# Patient Record
Sex: Male | Born: 1965 | Race: Black or African American | Hispanic: No
Health system: Southern US, Community
[De-identification: ages and names within clinical notes are randomized; demographics above are authoritative.]

## PROBLEM LIST (undated history)

## (undated) DIAGNOSIS — L910 Hypertrophic scar: Secondary | ICD-10-CM

---

## 1998-12-19 ENCOUNTER — Encounter: Payer: Self-pay | Admitting: Emergency Medicine

## 1998-12-19 ENCOUNTER — Emergency Department (HOSPITAL_COMMUNITY): Admission: EM | Admit: 1998-12-19 | Discharge: 1998-12-19 | Payer: Self-pay | Admitting: Emergency Medicine

## 2006-03-05 ENCOUNTER — Emergency Department (HOSPITAL_COMMUNITY): Admission: EM | Admit: 2006-03-05 | Discharge: 2006-03-05 | Payer: Self-pay | Admitting: Emergency Medicine

## 2006-03-12 ENCOUNTER — Emergency Department (HOSPITAL_COMMUNITY): Admission: EM | Admit: 2006-03-12 | Discharge: 2006-03-12 | Payer: Self-pay | Admitting: *Deleted

## 2007-05-06 ENCOUNTER — Emergency Department (HOSPITAL_COMMUNITY): Admission: EM | Admit: 2007-05-06 | Discharge: 2007-05-06 | Payer: Self-pay | Admitting: Emergency Medicine

## 2008-02-12 ENCOUNTER — Observation Stay (HOSPITAL_COMMUNITY): Admission: EM | Admit: 2008-02-12 | Discharge: 2008-02-12 | Payer: Self-pay | Admitting: Emergency Medicine

## 2008-11-27 ENCOUNTER — Emergency Department (HOSPITAL_COMMUNITY): Admission: EM | Admit: 2008-11-27 | Discharge: 2008-11-27 | Payer: Self-pay | Admitting: Emergency Medicine

## 2009-06-16 IMAGING — CR DG CHEST 2V
3 series · 3 of 3 positions shown · non-contrast
Comparison: None

CLINICAL DATA: Chest pain

CHEST - 2 VIEW

[w chest pa]
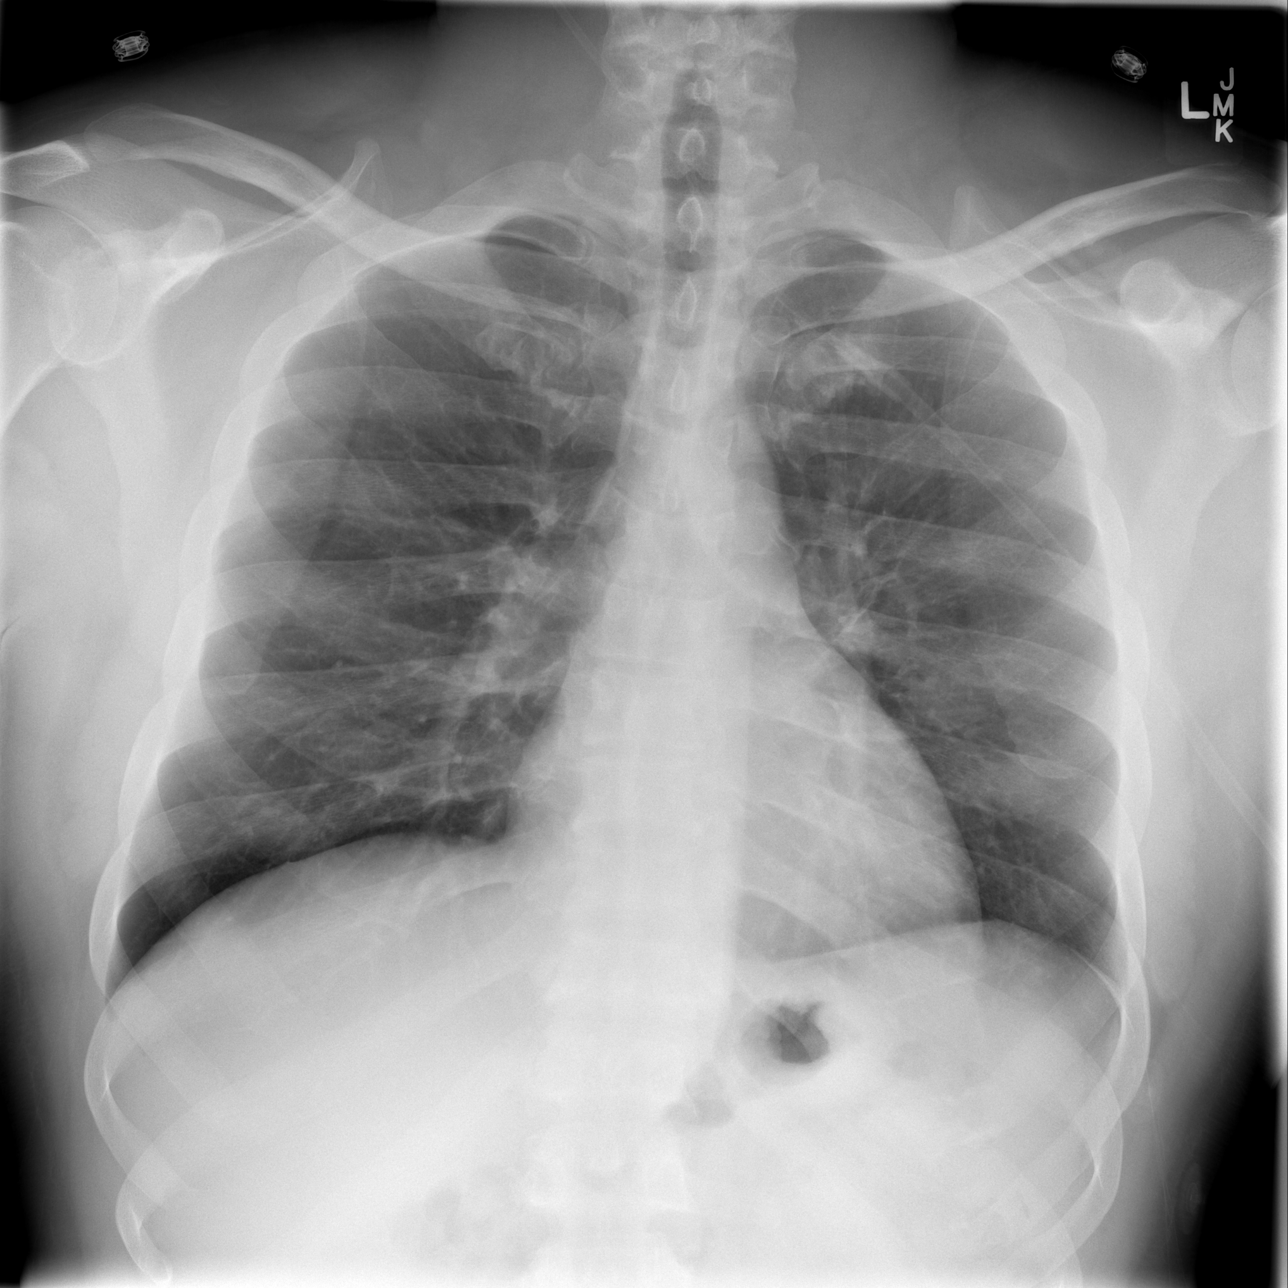

[w chest lat (1 of 2)]
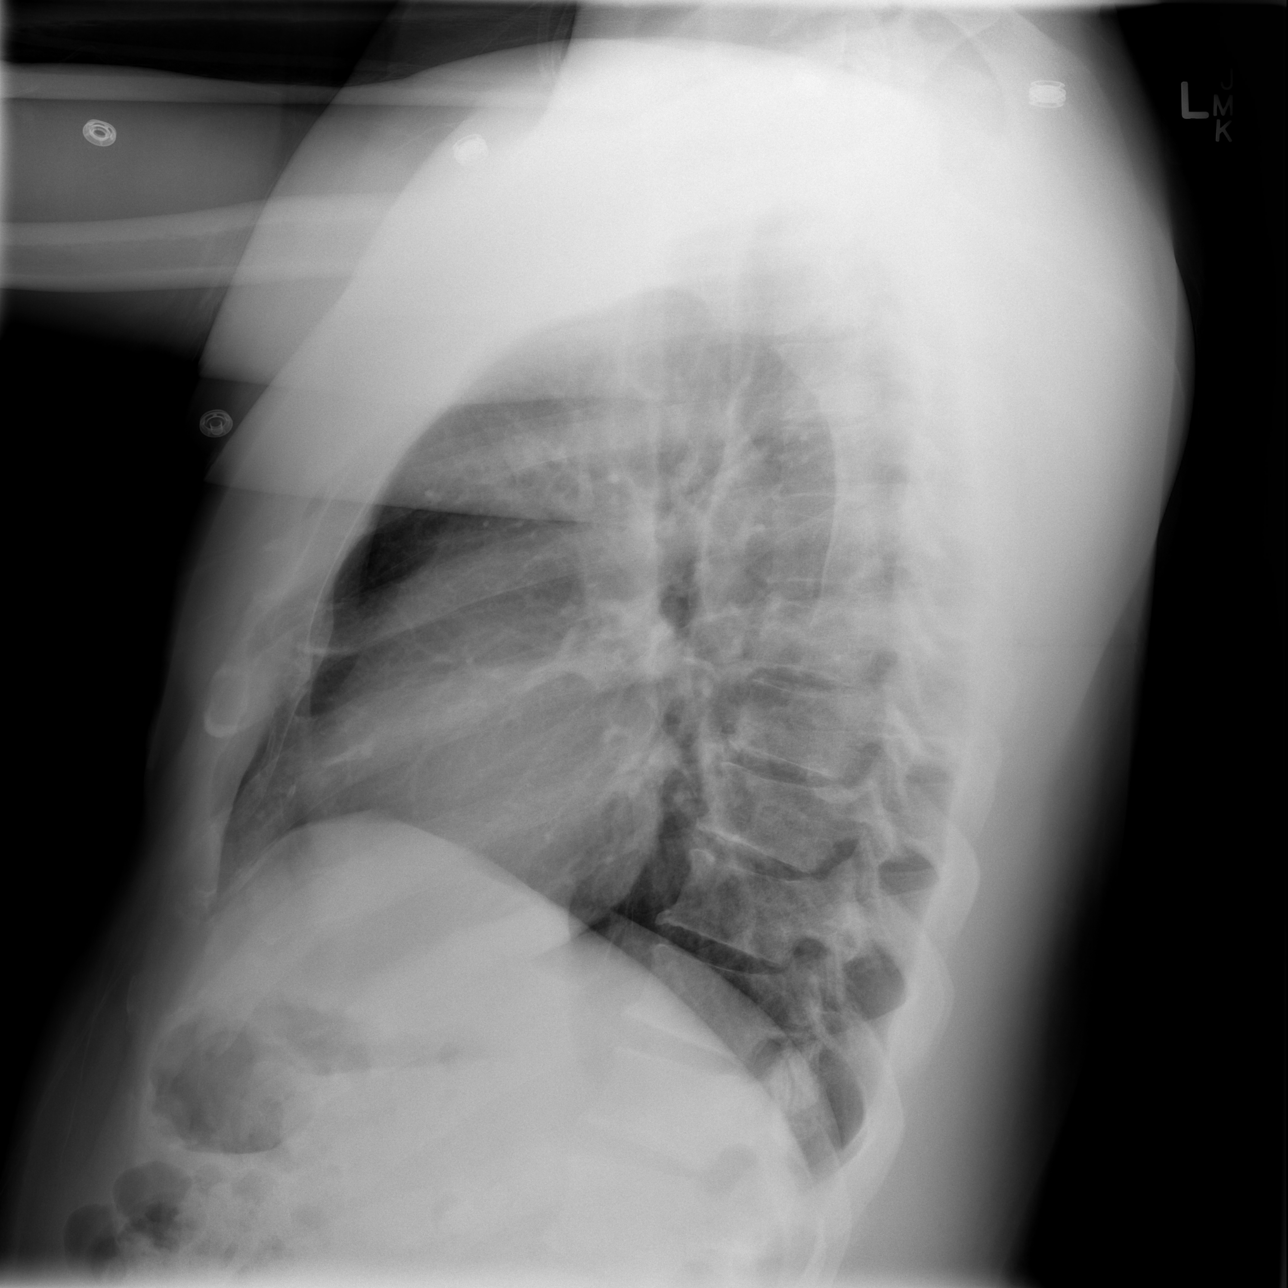

[w chest lat (2 of 2)]
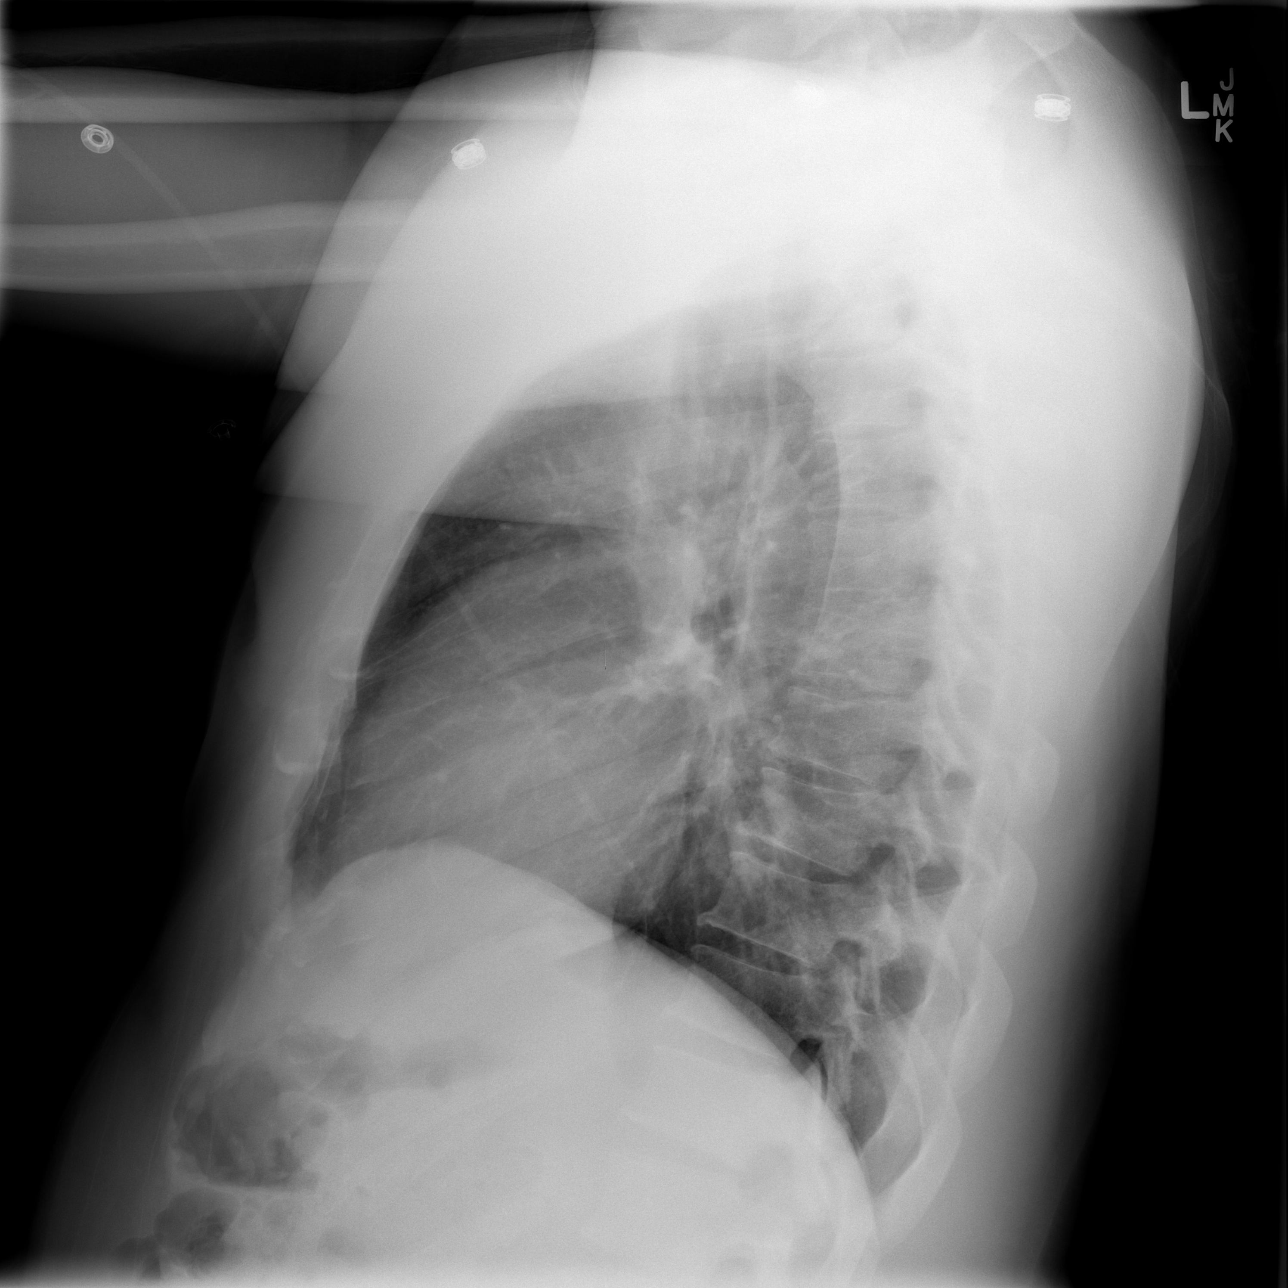

[3 of 3 positions shown; findings below may reference images not displayed]

FINDINGS: The heart size and mediastinal contours are within normal
limits.  Both lungs are clear.  The visualized skeletal structures
are unremarkable.
IMPRESSION: No active cardiopulmonary disease.

## 2011-01-16 LAB — CBC
HCT: 40.8 % (ref 39.0–52.0)
Hemoglobin: 13.8 g/dL (ref 13.0–17.0)
MCV: 91.5 fL (ref 78.0–100.0)
Platelets: 161 10*3/uL (ref 150–400)
WBC: 5.4 10*3/uL (ref 4.0–10.5)

## 2011-01-16 LAB — POCT CARDIAC MARKERS
CKMB, poc: 1.2 ng/mL (ref 1.0–8.0)
CKMB, poc: 1.8 ng/mL (ref 1.0–8.0)
Myoglobin, poc: 60.4 ng/mL (ref 12–200)
Troponin i, poc: <0.05 ng/mL (ref 0.00–0.09)
Troponin i, poc: <0.05 ng/mL (ref 0.00–0.09)

## 2011-01-16 LAB — BASIC METABOLIC PANEL
BUN: 15 mg/dL (ref 6–23)
Chloride: 101 mEq/L (ref 96–112)
GFR calc non Af Amer: >60 mL/min (ref >60)
Potassium: 3.8 mEq/L (ref 3.5–5.1)
Sodium: 133 mEq/L — ABNORMAL LOW (ref 135–145)

## 2011-02-13 NOTE — Discharge Summary (Signed)
Troy Lowery, Troy Lowery             ACCOUNT NO.:  [id number]   MEDICAL RECORD NO.:  [id number]          PATIENT TYPE:  INP   LOCATION:  5120                         FACILITY:  [facility]   PHYSICIAN:  Wilmon Arms. Corliss Skains, M.D. DATE OF BIRTH:  1966-04-09   DATE OF ADMISSION:  02/11/2008  DATE OF DISCHARGE:  02/12/2008                               DISCHARGE SUMMARY   ADMITTING TRAUMA SURGEON:  Dr. Jimmye Norman.   DISCHARGE DIAGNOSES:  1. Fall while playing basketball after being accidentally struck on      the face.  2. Concussion with brief loss of consciousness.  3. Posterior scalp laceration.  4. Mild acute blood loss anemia.  5. History of a heat stroke in 2008.   PROCEDURES:  The patient had a closure of his posterior scalp  laceration, I believe per Dr. Lindie Spruce on Feb 12, 2008.   HISTORY:  This is an otherwise healthy 45 year old black male who was  accidentally hit on the head with an elbow while playing basketball.  He  fell backwards and struck the back of his scalp and was briefly not  pale.  He had a CT scan of the head, C-spine, and maxillofacial CTs all  of which were negative for acute injuries.  The patient did have a right  posterior occipital scalp laceration which was closed with staples.  He  had some mild likely acute blood loss anemia with hemoglobin of 12.9 and  hematocrit of 38.9, secondary to blood loss per scalp.   The patient is tolerating regular diet and ambulatory and doing well at  the time of discharge.  As anticipated, he will be discharged later  today on Feb 12, 2008.   MEDICATIONS:  At the time of discharge include Norco 5/325 one to two  p.o. q.4-6 hours p.r.n. pain or ibuprofen or Tylenol p.r.n. for milder  pain.   FOLLOWUP:  The patient will follow up with trauma service on Feb 19, 2008 at 2:00 p.m. or sooner should he have difficulty in the interim.   DISCHARGE INSTRUCTIONS:  He was given instructions on head injury  concussion, laceration care,  and pain management.      Shawn Rayburn, P.A.      Wilmon Arms. Tsuei, M.D.  Electronically Signed    SR/MEDQ  D:  02/12/2008  T:  02/12/2008  Job:  [id number]   cc:   [facility]

## 2011-07-16 LAB — I-STAT 8, (EC8 V) (CONVERTED LAB)
Acid-base deficit: 2
BUN: 13
Chloride: 107
HCT: 43
Hemoglobin: 14.6
Operator id: 285491
Potassium: 3.8
Sodium: 140
pCO2, Ven: 36 — ABNORMAL LOW

## 2011-07-16 LAB — POCT I-STAT CREATININE: Creatinine, Ser: 1.2

## 2011-07-16 LAB — D-DIMER, QUANTITATIVE: D-Dimer, Quant: <0.22

## 2011-07-31 ENCOUNTER — Emergency Department (HOSPITAL_COMMUNITY)
Admission: EM | Admit: 2011-07-31 | Discharge: 2011-07-31 | Disposition: A | Discharge status: Home / Self Care | Payer: Self-pay | Attending: Emergency Medicine | Admitting: Emergency Medicine

## 2011-07-31 DIAGNOSIS — L910 Hypertrophic scar: Secondary | ICD-10-CM | POA: Insufficient documentation

## 2011-07-31 DIAGNOSIS — L989 Disorder of the skin and subcutaneous tissue, unspecified: Secondary | ICD-10-CM | POA: Insufficient documentation

## 2017-03-09 ENCOUNTER — Encounter (HOSPITAL_COMMUNITY): Payer: Self-pay | Admitting: Emergency Medicine

## 2017-03-09 ENCOUNTER — Emergency Department (HOSPITAL_COMMUNITY)
Admission: EM | Admit: 2017-03-09 | Discharge: 2017-03-10 | Disposition: A | Discharge status: Home / Self Care | Payer: Worker's Compensation | Attending: Emergency Medicine | Admitting: Emergency Medicine

## 2017-03-09 DIAGNOSIS — Y990 Civilian activity done for income or pay: Secondary | ICD-10-CM | POA: Insufficient documentation

## 2017-03-09 DIAGNOSIS — W260XXA Contact with knife, initial encounter: Secondary | ICD-10-CM | POA: Diagnosis not present

## 2017-03-09 DIAGNOSIS — Y939 Activity, unspecified: Secondary | ICD-10-CM | POA: Insufficient documentation

## 2017-03-09 DIAGNOSIS — Z23 Encounter for immunization: Secondary | ICD-10-CM | POA: Diagnosis not present

## 2017-03-09 DIAGNOSIS — Y929 Unspecified place or not applicable: Secondary | ICD-10-CM | POA: Insufficient documentation

## 2017-03-09 DIAGNOSIS — S61512A Laceration without foreign body of left wrist, initial encounter: Secondary | ICD-10-CM | POA: Insufficient documentation

## 2017-03-09 MED ORDER — LIDOCAINE-EPINEPHRINE-TETRACAINE (LET) SOLUTION
3.0000 mL | Freq: Once | NASAL | Status: AC
  Administered 2017-03-09: 3 mL via TOPICAL
  Filled 2017-03-09: qty 3

## 2017-03-09 MED ORDER — TETANUS-DIPHTH-ACELL PERTUSSIS 5-2.5-18.5 LF-MCG/0.5 IM SUSP
0.5000 mL | Freq: Once | INTRAMUSCULAR | Status: AC
  Administered 2017-03-09: 0.5 mL via INTRAMUSCULAR
  Filled 2017-03-09: qty 0.5

## 2017-03-09 NOTE — ED Provider Notes (Signed)
[facility] Provider Note   CSN: [id number] Arrival date & time: 03/09/17  2256     History   Chief Complaint Chief Complaint  Patient presents with  . Extremity Laceration    HPI Troy Lowery is a 51 y.o. male.  HPI   51 year old male presenting for evaluation of a hand injury. Patient works as a Investment banker, operational at a nearby hotel. While washing dishes today he accidentally cut his left dominant wrist with a knife. Incident happened approximately 2 hours ago. He did report moderate amount of bleeding which has since stopped after his coworker washed out this wound and put a Band-Aid over it. Per procedures, patient sent to the ED for further management. He is unable to recall his last tetanus shot. He reported minimal pain at this time. No associated numbness. He is not on any blood thinning medication. No other injury. Denies self-harm.  History reviewed. No pertinent past medical history.  There are no active problems to display for this patient.   History reviewed. No pertinent surgical history.     Home Medications    Prior to Admission medications   Not on File    Family History No family history on file.  Social History Social History  Substance Use Topics  . Smoking status: Never Smoker  . Smokeless tobacco: Never Used  . Alcohol use Yes     Comment: rarely     Allergies   Patient has no known allergies.   Review of Systems Review of Systems  Constitutional: Negative for fever.  Skin: Positive for wound.  Neurological: Negative for numbness.     Physical Exam Updated Vital Signs BP (!) 159/101 (BP Location: Left Arm)   Pulse 89   Temp 99 F (37.2 C) (Oral)   Resp 16   SpO2 100%   Physical Exam  Constitutional: He appears well-developed and well-nourished. No distress.  HENT:  Head: Atraumatic.  Eyes: Conjunctivae are normal.  Neck: Neck supple.  Neurological: He is alert.  Skin: No rash noted.  Left wrist: A 1 cm superficial  laceration noted to the volar aspects of left wrist on the radial side without any active bleeding or foreign object noted. Minimal tenderness to palpation. No surrounding edema, no numbness.  Psychiatric: He has a normal mood and affect.  Nursing note and vitals reviewed.    ED Treatments / Results  Labs (all labs ordered are listed, but only abnormal results are displayed) Labs Reviewed - No data to display  EKG  EKG Interpretation None       Radiology No results found.  Procedures Procedures (including critical care time)  LACERATION REPAIR Performed by: Fayrene Helper Authorized by: Fayrene Helper Consent: Verbal consent obtained. Risks and benefits: risks, benefits and alternatives were discussed Consent given by: patient Patient identity confirmed: provided demographic data Prepped and Draped in normal sterile fashion Wound explored  Laceration Location: L wrist, volar  Laceration Length: 1cm  No Foreign Bodies seen or palpated  Anesthesia: topical   Local anesthetic: LET  Anesthetic total: 3 ml  Irrigation method: syringe Amount of cleaning: standard  Skin closure: dermabond  Number of sutures: dermabond  Technique: dermabond  Patient tolerance: Patient tolerated the procedure well with no immediate complications.   Medications Ordered in ED Medications - No data to display   Initial Impression / Assessment and Plan / ED Course  I have reviewed the triage vital signs and the nursing notes.  Pertinent labs & imaging results that were available during  my care of the patient were reviewed by me and considered in my medical decision making (see chart for details).     BP (!) 159/101 (BP Location: Left Arm)   Pulse 89   Temp 99 F (37.2 C) (Oral)   Resp 16   SpO2 100%    Final Clinical Impressions(s) / ED Diagnoses   Final diagnoses:  Laceration of left wrist, initial encounter    New Prescriptions New Prescriptions   No medications on  file   11:35 PM Pt accidentally suffered a superficial lac to L wrist.  Wound will be cleanse and dermabond.  tdap updated here.    Fayrene Helperran, Keenen Roessner, PA-C 03/10/17 0023    Molpus, Jonny RuizJohn, MD 03/10/17 2240

## 2017-03-09 NOTE — ED Notes (Signed)
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 Pt states he accidentally punctured his left wrist. Bleeding controlled. Pt is unsure when his last tetanus shot was. Pt has workers Youth workercompensation paperwork at bedside

## 2017-03-09 NOTE — ED Notes (Signed)
Dermabond at bedside.  

## 2017-05-08 ENCOUNTER — Encounter (HOSPITAL_COMMUNITY): Payer: Self-pay | Admitting: Emergency Medicine

## 2017-05-08 ENCOUNTER — Emergency Department (HOSPITAL_COMMUNITY)
Admission: EM | Admit: 2017-05-08 | Discharge: 2017-05-08 | Disposition: A | Discharge status: Home / Self Care | Payer: Self-pay | Attending: Emergency Medicine | Admitting: Emergency Medicine

## 2017-05-08 DIAGNOSIS — R112 Nausea with vomiting, unspecified: Secondary | ICD-10-CM

## 2017-05-08 DIAGNOSIS — Z7982 Long term (current) use of aspirin: Secondary | ICD-10-CM | POA: Insufficient documentation

## 2017-05-08 DIAGNOSIS — I10 Essential (primary) hypertension: Secondary | ICD-10-CM | POA: Insufficient documentation

## 2017-05-08 LAB — COMPREHENSIVE METABOLIC PANEL
ALT: 15 U/L — ABNORMAL LOW (ref 17–63)
ANION GAP: 7 (ref 5–15)
AST: 22 U/L (ref 15–41)
Albumin: 4 g/dL (ref 3.5–5.0)
Alkaline Phosphatase: 42 U/L (ref 38–126)
BILIRUBIN TOTAL: 0.7 mg/dL (ref 0.3–1.2)
BUN: 16 mg/dL (ref 6–20)
CHLORIDE: 104 mmol/L (ref 101–111)
CO2: 28 mmol/L (ref 22–32)
Calcium: 9.3 mg/dL (ref 8.9–10.3)
Creatinine, Ser: 1.07 mg/dL (ref 0.61–1.24)
Glucose, Bld: 117 mg/dL — ABNORMAL HIGH (ref 65–99)
POTASSIUM: 4.2 mmol/L (ref 3.5–5.1)
Sodium: 139 mmol/L (ref 135–145)
TOTAL PROTEIN: 7.1 g/dL (ref 6.5–8.1)

## 2017-05-08 LAB — CBC
HEMATOCRIT: 38.6 % — AB (ref 39.0–52.0)
HEMOGLOBIN: 13 g/dL (ref 13.0–17.0)
MCH: 29.8 pg (ref 26.0–34.0)
MCHC: 33.7 g/dL (ref 30.0–36.0)
MCV: 88.5 fL (ref 78.0–100.0)
Platelets: 170 10*3/uL (ref 150–400)
RBC: 4.36 MIL/uL (ref 4.22–5.81)
RDW: 12.2 % (ref 11.5–15.5)
WBC: 5.4 10*3/uL (ref 4.0–10.5)

## 2017-05-08 LAB — I-STAT TROPONIN, ED: Troponin i, poc: 0 ng/mL (ref 0.00–0.08)

## 2017-05-08 LAB — LIPASE, BLOOD: LIPASE: 33 U/L (ref 11–51)

## 2017-05-08 MED ORDER — ONDANSETRON 8 MG PO TBDP: 8.0000 mg | ORAL_TABLET | Freq: Three times a day (TID) | ORAL | 0 refills | Status: DC | PRN

## 2017-05-08 MED ORDER — HYDROCHLOROTHIAZIDE 25 MG PO TABS: 25.0000 mg | ORAL_TABLET | Freq: Every day | ORAL | 1 refills | Status: DC

## 2017-05-08 NOTE — Discharge Instructions (Signed)
Take the medications as needed for nausea. Follow-up with a primary care doctor to recheck  on your blood pressure.

## 2017-05-08 NOTE — ED Provider Notes (Signed)
[facility] Provider Note   CSN: [id number] Arrival date & time: 05/08/17  1737     History   Chief Complaint Chief Complaint  Patient presents with  . Abdominal Pain  . Emesis    HPI Troy Lowery is a 51 y.o. male.  HPI Patient presents to the emergency room for evaluation of nausea vomiting and diaphoresis.  Patient ate a hamburger at home before he started riding his bike to work. On the ride to work he started to feel somewhat nauseated and felt sweaty. He felt like he needed to eat something so in the break room he drinks water and ate some potato chips. He mentioned to his boss that he was not feeling well when he felt sick at home. The patient got back on his bike to ride home. As he was riding he started to feel more sweaty. He went to a fire station. While he was there they noted that he is very diaphoretic. Patient also mentioned he started having some chest discomfort. He was brought to the emergency room for evaluation. Patient states all his symptoms have resolved. He is no longer nauseous. The chest discomfort was brief and lasted for about 5 minutes. His no longer having any discomfort. he denies any shortness of breath. No diarrhea. No history of heart disease or lung disease. No history of PE or DVT. History reviewed. No pertinent past medical history.  There are no active problems to display for this patient.   History reviewed. No pertinent surgical history.     Home Medications    Prior to Admission medications   Medication Sig Start Date End Date Taking? Authorizing Provider  ASPIRIN PO Take 2 tablets by mouth daily.   Yes [provider]  hydrochlorothiazide (HYDRODIURIL) 25 MG tablet Take 1 tablet (25 mg total) by mouth daily. 05/08/17   Linwood Dibbles, MD  ondansetron (ZOFRAN ODT) 8 MG disintegrating tablet Take 1 tablet (8 mg total) by mouth every 8 (eight) hours as needed for nausea or vomiting. 05/08/17   Linwood Dibbles, MD    Family  History No family history on file.  Social History Social History  Substance Use Topics  . Smoking status: Never Smoker  . Smokeless tobacco: Never Used  . Alcohol use Yes     Comment: weekends     Allergies   Patient has no known allergies.   Review of Systems Review of Systems  All other systems reviewed and are negative.    Physical Exam Updated Vital Signs BP (!) 167/107   Pulse 68   Temp 98.2 F (36.8 C) (Oral)   Resp 13   SpO2 98%   Physical Exam  Constitutional: He appears well-developed and well-nourished. No distress.  HENT:  Head: Normocephalic and atraumatic.  Right Ear: External ear normal.  Left Ear: External ear normal.  Eyes: Conjunctivae are normal. Right eye exhibits no discharge. Left eye exhibits no discharge. No scleral icterus.  Neck: Neck supple. No tracheal deviation present.  Cardiovascular: Normal rate, regular rhythm and intact distal pulses.   Pulmonary/Chest: Effort normal and breath sounds normal. No stridor. No respiratory distress. He has no wheezes. He has no rales.  Abdominal: Soft. Bowel sounds are normal. He exhibits no distension. There is no tenderness. There is no rebound and no guarding.  Musculoskeletal: He exhibits no edema or tenderness.  Neurological: He is alert. He has normal strength. No cranial nerve deficit (no facial droop, extraocular movements intact, no slurred speech) or  sensory deficit. He exhibits normal muscle tone. He displays no seizure activity. Coordination normal.  Skin: Skin is warm and dry. No rash noted.  Psychiatric: He has a normal mood and affect.  Nursing note and vitals reviewed.    ED Treatments / Results  Labs (all labs ordered are listed, but only abnormal results are displayed) Labs Reviewed  COMPREHENSIVE METABOLIC PANEL - Abnormal; Notable for the following:       Result Value   Glucose, Bld 117 (*)    ALT 15 (*)    All other components within normal limits  CBC - Abnormal; Notable  for the following:    HCT 38.6 (*)    All other components within normal limits  LIPASE, BLOOD  I-STAT TROPONIN, ED    EKG  EKG Interpretation  Date/Time:  Wednesday May 08 2017 22:36:53 EDT Ventricular Rate:  67 PR Interval:    QRS Duration: 91 QT Interval:  401 QTC Calculation: 424 R Axis:   71 Text Interpretation:  Sinus rhythm Consider left atrial enlargement ST elev, probable normal early repol pattern No significant change since last tracing Confirmed by Linwood DibblesKnapp, Sy Saintjean [phone]([id number]) on 05/08/2017 11:42:44 PM       Radiology No results found.  Procedures Procedures (including critical care time)  Medications Ordered in ED Medications - No data to display   Initial Impression / Assessment and Plan / ED Course  I have reviewed the triage vital signs and the nursing notes.  Pertinent labs & imaging results that were available during my care of the patient were reviewed by me and considered in my medical decision making (see chart for details).   patient presented to the emergency room for nausea and vomiting. This was associated with diaphoresis while riding his bike. Laboratory tests are reassuring. He has no complaints of chest pain or abdominal pain. I doubt acute infection, cardiac ischemia or other acute medical condition. Patient's blood pressure was noted to be elevated. I recommended follow-up with a primary care doctor. I will give him a prescription for hydrochlorothiazide.  Final Clinical Impressions(s) / ED Diagnoses   Final diagnoses:  Non-intractable vomiting with nausea, unspecified vomiting type  Hypertension, unspecified type    New Prescriptions New Prescriptions   HYDROCHLOROTHIAZIDE (HYDRODIURIL) 25 MG TABLET    Take 1 tablet (25 mg total) by mouth daily.   ONDANSETRON (ZOFRAN ODT) 8 MG DISINTEGRATING TABLET    Take 1 tablet (8 mg total) by mouth every 8 (eight) hours as needed for nausea or vomiting.     Linwood DibblesKnapp, Elisa Sorlie, MD 05/08/17 [phone]

## 2017-05-08 NOTE — ED Triage Notes (Signed)
Per EMS, pt from fire dept.  Pt c/o n/v since 1:30pm.  Abdominal pain.  Vitals: 141 78, hr 90, resp 18, 98% ra

## 2017-05-08 NOTE — ED Triage Notes (Signed)
Patient being at work in break room when he drank water and ate potato chips then when went to clock in told boss that he had vomited twice at home before going to work.  Patient riding bicycle home and when got to fire station chest started hurting and spit twice and fire men asking if patient ok, pt reports sweating. Reports BP was high when fire took it, so called EMs who brought patient here.

## 2018-08-10 ENCOUNTER — Encounter (HOSPITAL_COMMUNITY): Payer: Self-pay

## 2018-08-10 ENCOUNTER — Ambulatory Visit (HOSPITAL_COMMUNITY)
Admission: EM | Admit: 2018-08-10 | Discharge: 2018-08-10 | Disposition: A | Discharge status: Home / Self Care | Payer: Self-pay | Attending: Family Medicine | Admitting: Family Medicine

## 2018-08-10 DIAGNOSIS — J069 Acute upper respiratory infection, unspecified: Secondary | ICD-10-CM

## 2018-08-10 DIAGNOSIS — Z79899 Other long term (current) drug therapy: Secondary | ICD-10-CM | POA: Insufficient documentation

## 2018-08-10 DIAGNOSIS — B9789 Other viral agents as the cause of diseases classified elsewhere: Secondary | ICD-10-CM

## 2018-08-10 DIAGNOSIS — Z7982 Long term (current) use of aspirin: Secondary | ICD-10-CM | POA: Insufficient documentation

## 2018-08-10 LAB — POCT RAPID STREP A: Streptococcus, Group A Screen (Direct): NEGATIVE

## 2018-08-10 MED ORDER — FLUTICASONE PROPIONATE 50 MCG/ACT NA SUSP: 2.0000 | Freq: Every day | NASAL | 0 refills | Status: DC

## 2018-08-10 MED ORDER — CETIRIZINE HCL 10 MG PO CHEW: 10.0000 mg | CHEWABLE_TABLET | Freq: Every day | ORAL | 0 refills | Status: DC

## 2018-08-10 MED ORDER — BENZONATATE 100 MG PO CAPS: 100.0000 mg | ORAL_CAPSULE | Freq: Three times a day (TID) | ORAL | 0 refills | Status: DC

## 2018-08-10 NOTE — Discharge Instructions (Addendum)
Strep was negative Get plenty of rest and push fluids Tessalon Perles prescribed for cough Zyrtec prescribed for runny nose, and/or sore throat Flonase prescribed for nasal congestion and runny nose Use medications daily for symptom relief Use OTC medications like ibuprofen or tylenol as needed fever or pain Follow up with PCP or with [facility] if symptoms persist Return or go to ER if you have any new or worsening symptoms fever, chills, nausea, vomiting, chest pain, cough, shortness of breath, wheezing, abdominal pain, changes in bowel or bladder habits, etc..Marland Kitchen

## 2018-08-10 NOTE — ED Triage Notes (Signed)
Pt presents with congestion, cough, nasal drainage, fever, fatigue, and body aches.

## 2018-08-10 NOTE — ED Provider Notes (Signed)
[facility]   [id number] 08/10/18 Arrival Time: 1101   CC: URI symptoms   SUBJECTIVE: History from: patient.  Webber Michiels Kamphuis is a 52 y.o. male who presents with abrupt onset of nasal congestion, runny nose, dry cough, and fatigue x 3 days.  Denies positive sick exposure or precipitating event.  Has tried OTC mucinex and throat lozenges with relief.  Symptoms are made worse with lying down at night.  Denies previous symptoms in the past. Complains of subject fever, chills, and 4 episodes of post-tussive emesis over the past 2 days. Denies SOB, wheezing, chest pain, nausea, changes in bowel or bladder habits.     Received flu shot this year: no.  ROS: As per HPI.  History reviewed. No pertinent past medical history. History reviewed. No pertinent surgical history. No Known Allergies No current facility-administered medications on file prior to encounter.    Current Outpatient Medications on File Prior to Encounter  Medication Sig Dispense Refill  . ASPIRIN PO Take 2 tablets by mouth daily.    . hydrochlorothiazide (HYDRODIURIL) 25 MG tablet Take 1 tablet (25 mg total) by mouth daily. 30 tablet 1   Social History   Socioeconomic History  . Marital status: Legally Separated    Spouse name: Not on file  . Number of children: Not on file  . Years of education: Not on file  . Highest education level: Not on file  Occupational History  . Not on file  Social Needs  . Financial resource strain: Not on file  . Food insecurity:    Worry: Not on file    Inability: Not on file  . Transportation needs:    Medical: Not on file    Non-medical: Not on file  Tobacco Use  . Smoking status: Never Smoker  . Smokeless tobacco: Never Used  Substance and Sexual Activity  . Alcohol use: Yes    Comment: weekends  . Drug use: No  . Sexual activity: Not on file  Lifestyle  . Physical activity:    Days per week: Not on file    Minutes per session: Not on file  . Stress: Not  on file  Relationships  . Social connections:    Talks on phone: Not on file    Gets together: Not on file    Attends religious service: Not on file    Active member of club or organization: Not on file    Attends meetings of clubs or organizations: Not on file    Relationship status: Not on file  . Intimate partner violence:    Fear of current or ex partner: Not on file    Emotionally abused: Not on file    Physically abused: Not on file    Forced sexual activity: Not on file  Other Topics Concern  . Not on file  Social History Narrative  . Not on file   History reviewed. No pertinent family history.  OBJECTIVE:  Vitals:   08/10/18 1119  BP: (!) 169/91  Pulse: 89  Resp: 20  Temp: 98.4 F (36.9 C)  TempSrc: Oral  SpO2: 99%     General appearance: alert; appears fatigued, but nontoxic; speaking in full sentences and tolerating own secretions HEENT: NCAT; Ears: EACs clear, TMs pearly gray; Eyes: PERRL.  EOM grossly intact. Sinuses: nontender; Nose: nares patent with mild clear rhinorrhea, Throat: oropharynx clear, tonsils non erythematous or enlarged, uvula midline  Neck: supple without LAD Lungs: unlabored respirations, symmetrical air entry; cough: absent; no  respiratory distress; CTAB Heart: regular rate and rhythm.  Radial pulses 2+ symmetrical bilaterally Skin: warm and dry Psychological: alert and cooperative; normal mood and affect  LABS:  Results for orders placed or performed during the hospital encounter of 08/10/18 (from the past 24 hour(s))  POCT rapid strep A [facility])     Status: None   Collection Time: 08/10/18 11:39 AM  Result Value Ref Range   Streptococcus, Group A Screen (Direct) NEGATIVE NEGATIVE    ASSESSMENT & PLAN:  1. Viral URI with cough     Meds ordered this encounter  Medications  . fluticasone (FLONASE) 50 MCG/ACT nasal spray    Sig: Place 2 sprays into both nostrils daily.    Dispense:  16 g    Refill:  0    Order  Specific Question:   Supervising Provider    Answer:   Isa Rankin [phone]  . cetirizine (ZYRTEC) 10 MG chewable tablet    Sig: Chew 1 tablet (10 mg total) by mouth daily.    Dispense:  20 tablet    Refill:  0    Order Specific Question:   Supervising Provider    Answer:   Isa Rankin [phone]  . benzonatate (TESSALON) 100 MG capsule    Sig: Take 1 capsule (100 mg total) by mouth every 8 (eight) hours.    Dispense:  21 capsule    Refill:  0    Order Specific Question:   Supervising Provider    Answer:   Isa Rankin [[id number]]    Get plenty of rest and push fluids Tessalon Perles prescribed for cough Zyrtec prescribed for runny nose, and/or sore throat Flonase prescribed for nasal congestion and runny nose Use medications daily for symptom relief Use OTC medications like ibuprofen or tylenol as needed fever or pain Follow up with PCP or with [facility] if symptoms persist Return or go to ER if you have any new or worsening symptoms fever, chills, nausea, vomiting, chest pain, cough, shortness of breath, wheezing, abdominal pain, changes in bowel or bladder habits, etc...  Reviewed expectations re: course of current medical issues. Questions answered. Outlined signs and symptoms indicating need for more acute intervention. Patient verbalized understanding. After Visit Summary given.         Rennis Harding, PA-C 08/10/18 1144

## 2018-08-12 LAB — CULTURE, GROUP A STREP (THRC)

## 2018-12-06 ENCOUNTER — Encounter (HOSPITAL_COMMUNITY): Payer: Self-pay | Admitting: Emergency Medicine

## 2018-12-06 ENCOUNTER — Ambulatory Visit (HOSPITAL_COMMUNITY)
Admission: EM | Admit: 2018-12-06 | Discharge: 2018-12-06 | Disposition: A | Discharge status: Home / Self Care | Payer: Self-pay | Attending: Family Medicine | Admitting: Family Medicine

## 2018-12-06 ENCOUNTER — Other Ambulatory Visit: Payer: Self-pay

## 2018-12-06 DIAGNOSIS — R05 Cough: Secondary | ICD-10-CM

## 2018-12-06 DIAGNOSIS — R066 Hiccough: Secondary | ICD-10-CM

## 2018-12-06 DIAGNOSIS — R6889 Other general symptoms and signs: Secondary | ICD-10-CM

## 2018-12-06 DIAGNOSIS — R059 Cough, unspecified: Secondary | ICD-10-CM

## 2018-12-06 HISTORY — DX: Hypertrophic scar: L91.0

## 2018-12-06 MED ORDER — CHLORPROMAZINE HCL 25 MG PO TABS: 25.0000 mg | ORAL_TABLET | Freq: Three times a day (TID) | ORAL | 0 refills | Status: AC

## 2018-12-06 MED ORDER — ACETAMINOPHEN 325 MG PO TABS
650.0000 mg | ORAL_TABLET | Freq: Once | ORAL | Status: AC
  Administered 2018-12-06: 650 mg via ORAL

## 2018-12-06 MED ORDER — ACETAMINOPHEN 325 MG PO TABS
ORAL_TABLET | ORAL | Status: AC
  Filled 2018-12-06: qty 2

## 2018-12-06 MED ORDER — OSELTAMIVIR PHOSPHATE 75 MG PO CAPS: 75.0000 mg | ORAL_CAPSULE | Freq: Two times a day (BID) | ORAL | 0 refills | Status: DC

## 2018-12-06 NOTE — ED Triage Notes (Signed)
Onset yesterday of feeling bad, sneezing.  Unknown fever.  Minimal cough.  C/o hiccups.  No nausea,no vomiting and no diarrhea.   Job needs a note to come back to work.

## 2018-12-06 NOTE — ED Provider Notes (Signed)
[facility]    CSN: [id number] Arrival date & time: 12/06/18  1114     History   Chief Complaint Chief Complaint  Patient presents with  . Fatigue    HPI Troy Lowery is a 53 y.o. male.   Onset yesterday of feeling bad, sneezing.  Unknown fever.  Minimal cough.  C/o hiccups.  No nausea,no vomiting and no diarrhea.   Job needs a note to come back to work.    Patient works in [employer] of the [employer].     Past Medical History:  Diagnosis Date  . Keloid     There are no active problems to display for this patient.   History reviewed. No pertinent surgical history.     Home Medications    Prior to Admission medications   Medication Sig Start Date End Date Taking? Authorizing Provider  ASPIRIN PO Take 2 tablets by mouth daily.   Yes [provider]  chlorproMAZINE (THORAZINE) 25 MG tablet Take 1 tablet (25 mg total) by mouth 3 (three) times daily. 12/06/18   Elvina Sidle, MD  oseltamivir (TAMIFLU) 75 MG capsule Take 1 capsule (75 mg total) by mouth every 12 (twelve) hours. 12/06/18   Elvina Sidle, MD    Family History History reviewed. No pertinent family history.  Social History Social History   Tobacco Use  . Smoking status: Never Smoker  . Smokeless tobacco: Never Used  Substance Use Topics  . Alcohol use: Yes    Comment: weekends  . Drug use: No     Allergies   Patient has no known allergies.   Review of Systems Review of Systems   Physical Exam Triage Vital Signs ED Triage Vitals [12/06/18 1222]  Enc Vitals Group     BP (!) 150/83     Pulse Rate (!) 109     Resp 20     Temp (!) 101.5 F (38.6 C)     Temp Source Oral     SpO2 97 %     Weight      Height      Head Circumference      Peak Flow      Pain Score      Pain Loc      Pain Edu?      Excl. in GC?    No data found.  Updated Vital Signs BP (!) 150/83 (BP Location: Left Arm) Comment: large cuff  Pulse (!) 109   Temp (!) 101.5 F  (38.6 C) (Oral)   Resp 20   SpO2 97%    Physical Exam Vitals signs and nursing note reviewed.  Constitutional:      Appearance: Normal appearance.  HENT:     Head: Normocephalic.     Right Ear: External ear normal. There is impacted cerumen.     Left Ear: External ear normal. There is impacted cerumen.     Nose: Congestion present.     Mouth/Throat:     Mouth: Mucous membranes are moist.  Eyes:     Conjunctiva/sclera: Conjunctivae normal.  Neck:     Musculoskeletal: Normal range of motion and neck supple.  Cardiovascular:     Rate and Rhythm: Normal rate.     Heart sounds: Normal heart sounds.  Pulmonary:     Effort: Pulmonary effort is normal.     Breath sounds: Normal breath sounds.  Musculoskeletal: Normal range of motion.  Skin:    General: Skin is warm and dry.  Neurological:  General: No focal deficit present.     Mental Status: He is alert and oriented to person, place, and time.  Psychiatric:        Mood and Affect: Mood normal.      UC Treatments / Results  Labs (all labs ordered are listed, but only abnormal results are displayed) Labs Reviewed - No data to display  EKG None  Radiology No results found.  Procedures Procedures (including critical care time)  Medications Ordered in UC Medications  acetaminophen (TYLENOL) tablet 650 mg (650 mg Oral Given 12/06/18 1228)    Initial Impression / Assessment and Plan / UC Course  I have reviewed the triage vital signs and the nursing notes.  Pertinent labs & imaging results that were available during my care of the patient were reviewed by me and considered in my medical decision making (see chart for details).    Final Clinical Impressions(s) / UC Diagnoses   Final diagnoses:  Flu-like symptoms  Intractable hiccups  Cough   Discharge Instructions   None    ED Prescriptions    Medication Sig Dispense Auth. Provider   oseltamivir (TAMIFLU) 75 MG capsule Take 1 capsule (75 mg total) by  mouth every 12 (twelve) hours. 10 capsule Elvina Sidle, MD   chlorproMAZINE (THORAZINE) 25 MG tablet Take 1 tablet (25 mg total) by mouth 3 (three) times daily. 12 tablet Elvina Sidle, MD     Controlled Substance Prescriptions [place] Controlled Substance Registry consulted?   Elvina Sidle, MD 12/06/18 1258

## 2019-11-30 ENCOUNTER — Encounter (HOSPITAL_COMMUNITY): Payer: Self-pay

## 2019-11-30 ENCOUNTER — Ambulatory Visit (HOSPITAL_COMMUNITY)
Admission: EM | Admit: 2019-11-30 | Discharge: 2019-11-30 | Disposition: A | Discharge status: Home / Self Care | Payer: Self-pay | Attending: Family Medicine | Admitting: Family Medicine

## 2019-11-30 ENCOUNTER — Other Ambulatory Visit: Payer: Self-pay

## 2019-11-30 DIAGNOSIS — Z20822 Contact with and (suspected) exposure to covid-19: Secondary | ICD-10-CM | POA: Insufficient documentation

## 2019-11-30 NOTE — ED Provider Notes (Signed)
[facility]    CSN: [id number] Arrival date & time: 11/30/19  1122      History   Chief Complaint Chief Complaint  Patient presents with  . COVID testing    HPI Troy Lowery is a 54 y.o. male.   Reports that his girlfriend, whom he lives with was diagnosed with Covid on 11/24/2019.  Patient states that per his employer, he needs to have a Covid test and a note in order to return to work.  Patient denies any symptoms today, including headache, cough, shortness of breath, chest tightness, nausea, vomiting, diarrhea, sore throat, rash, other symptoms.  ROS per HPI  The history is provided by the patient.    Past Medical History:  Diagnosis Date  . Keloid     There are no problems to display for this patient.   History reviewed. No pertinent surgical history.     Home Medications    Prior to Admission medications   Medication Sig Start Date End Date Taking? Authorizing Provider  ASPIRIN PO Take 2 tablets by mouth daily.    [provider]  chlorproMAZINE (THORAZINE) 25 MG tablet Take 1 tablet (25 mg total) by mouth 3 (three) times daily. 12/06/18   Elvina Sidle, MD  oseltamivir (TAMIFLU) 75 MG capsule Take 1 capsule (75 mg total) by mouth every 12 (twelve) hours. 12/06/18   Elvina Sidle, MD    Family History Family History  Problem Relation Age of Onset  . Hypertension Mother   . Hypertension Father     Social History Social History   Tobacco Use  . Smoking status: Current Every Day Smoker    Types: Cigars  . Smokeless tobacco: Never Used  Substance Use Topics  . Alcohol use: Yes  . Drug use: No     Allergies   Patient has no known allergies.   Review of Systems Review of Systems   Physical Exam Triage Vital Signs ED Triage Vitals  Enc Vitals Group     BP 11/30/19 1142 (!) 156/94     Pulse Rate 11/30/19 1142 89     Resp 11/30/19 1142 19     Temp 11/30/19 1142 98.7 F (37.1 C)     Temp Source 11/30/19 1142 Oral      SpO2 11/30/19 1142 98 %     Weight 11/30/19 1138 248 lb 3.2 oz (112.6 kg)     Height --      Head Circumference --      Peak Flow --      Pain Score 11/30/19 1138 0     Pain Loc --      Pain Edu? --      Excl. in GC? --    No data found.  Updated Vital Signs BP (!) 156/94 (BP Location: Left Arm)   Pulse 89   Temp 98.7 F (37.1 C) (Oral)   Resp 19   Wt 248 lb 3.2 oz (112.6 kg)   SpO2 98%   Visual Acuity Right Eye Distance:   Left Eye Distance:   Bilateral Distance:    Right Eye Near:   Left Eye Near:    Bilateral Near:     Physical Exam Vitals and nursing note reviewed.  Constitutional:      General: He is not in acute distress.    Appearance: Normal appearance. He is well-developed and normal weight.  HENT:     Head: Normocephalic and atraumatic.     Right Ear: Tympanic membrane normal.  Left Ear: Tympanic membrane normal.     Nose: Nose normal.  Eyes:     Conjunctiva/sclera: Conjunctivae normal.  Cardiovascular:     Rate and Rhythm: Normal rate and regular rhythm.     Heart sounds: Normal heart sounds. No murmur.  Pulmonary:     Effort: Pulmonary effort is normal. No respiratory distress.     Breath sounds: Normal breath sounds.  Abdominal:     General: Abdomen is flat. Bowel sounds are normal. There is no distension.     Palpations: Abdomen is soft.     Tenderness: There is no abdominal tenderness.  Musculoskeletal:        General: Normal range of motion.     Cervical back: Neck supple.  Skin:    General: Skin is warm and dry.     Capillary Refill: Capillary refill takes less than 2 seconds.  Neurological:     General: No focal deficit present.     Mental Status: He is alert and oriented to person, place, and time.  Psychiatric:        Mood and Affect: Mood normal.        Behavior: Behavior normal.      UC Treatments / Results  Labs (all labs ordered are listed, but only abnormal results are displayed) Labs Reviewed  NOVEL CORONAVIRUS,  NAA (HOSP ORDER, SEND-OUT TO REF LAB; TAT 18-24 HRS)    EKG   Radiology No results found.  Procedures Procedures (including critical care time)  Medications Ordered in UC Medications - No data to display  Initial Impression / Assessment and Plan / UC Course  I have reviewed the triage vital signs and the nursing notes.  Pertinent labs & imaging results that were available during my care of the patient were reviewed by me and considered in my medical decision making (see chart for details).     Presents today for exposure to positive Covid from his girlfriend, whom he lives with.  Denies all symptoms, reports that his girlfriend was asymptomatic as well but did test positive.  Send out Covid test obtained, will call patient as soon as results are in.  Instructed to quarantine until results are back and negative.  Instructed on when to go to the ER. Final Clinical Impressions(s) / UC Diagnoses   Final diagnoses:  Exposure to COVID-19 virus     Discharge Instructions     Your COVID test is pending.  You should self quarantine until your test result is back and is negative.    Take Tylenol as needed for fever or discomfort.  Rest and keep yourself hydrated.    Go to the emergency department if you develop high fever, shortness of breath, severe diarrhea, or other concerning symptoms.       ED Prescriptions    None     PDMP not reviewed this encounter.   Faustino Congress, NP 11/30/19 1411

## 2019-11-30 NOTE — ED Triage Notes (Signed)
Pt is here wanting COVID testing his girlfriend tested POSITIVE for COVID on 11/24/2019. Pt is denying ALL symptoms today.

## 2019-11-30 NOTE — Discharge Instructions (Signed)
Your COVID test is pending.  You should self quarantine until your test result is back and is negative.    Take Tylenol as needed for fever or discomfort.  Rest and keep yourself hydrated.    Go to the emergency department if you develop high fever, shortness of breath, severe diarrhea, or other concerning symptoms.    

## 2019-12-02 LAB — NOVEL CORONAVIRUS, NAA (HOSP ORDER, SEND-OUT TO REF LAB; TAT 18-24 HRS): SARS-CoV-2, NAA: NOT DETECTED

## 2021-03-27 ENCOUNTER — Other Ambulatory Visit: Payer: Self-pay

## 2021-03-27 ENCOUNTER — Ambulatory Visit (HOSPITAL_COMMUNITY)
Admission: EM | Admit: 2021-03-27 | Discharge: 2021-03-27 | Disposition: A | Discharge status: Home / Self Care | Payer: Self-pay | Attending: Internal Medicine | Admitting: Internal Medicine

## 2021-03-27 ENCOUNTER — Encounter (HOSPITAL_COMMUNITY): Payer: Self-pay

## 2021-03-27 ENCOUNTER — Ambulatory Visit (INDEPENDENT_AMBULATORY_CARE_PROVIDER_SITE_OTHER): Payer: Self-pay

## 2021-03-27 DIAGNOSIS — M25562 Pain in left knee: Secondary | ICD-10-CM

## 2021-03-27 DIAGNOSIS — I10 Essential (primary) hypertension: Secondary | ICD-10-CM

## 2021-03-27 DIAGNOSIS — M25462 Effusion, left knee: Secondary | ICD-10-CM

## 2021-03-27 MED ORDER — IBUPROFEN 800 MG PO TABS: 800.0000 mg | ORAL_TABLET | Freq: Three times a day (TID) | ORAL | 0 refills | Status: DC | PRN

## 2021-03-27 MED ORDER — AMLODIPINE BESYLATE 2.5 MG PO TABS: 2.5000 mg | ORAL_TABLET | Freq: Every day | ORAL | 2 refills | Status: AC

## 2021-03-27 NOTE — ED Provider Notes (Addendum)
[facility]    CSN: [id number] Arrival date & time: 03/27/21  2671      History   Chief Complaint Chief Complaint  Patient presents with   Knee Pain    HPI Troy Lowery is a 55 y.o. male.   Patient was at the park with his children approximately 3 days ago and jumped down from monkey bars and is now having pain, swelling, and limited ROM in in his left knee. Patient is not sure how high the monkey bars were that he jumped down from. Denies pain in foot or upper or lower leg. Pain is localized to left knee. Denies history of chronic knee pain or past injury to left knee. Patient is able to bear weight but states that it is difficult to bear weight. Patient states that sitting position is when pain is most severe and will have to get up to walk around if sitting for prolonged period of time. Patient states that he is unable to straighten or bend left knee completely. Pain is 8/10 on 1-10 pain scale and is described as a "tightening pain" surrounding his knee.   Denies history of hypertension. States that many of his family members have hypertension. Patient is asymptomatic. Denies headache, chest pain, dizziness, blurred vision, nausea, and vomiting.    Knee Pain  Past Medical History:  Diagnosis Date   Keloid     There are no problems to display for this patient.   History reviewed. No pertinent surgical history.     Home Medications    Prior to Admission medications   Medication Sig Start Date End Date Taking? Authorizing Provider  amLODipine (NORVASC) 2.5 MG tablet Take 1 tablet (2.5 mg total) by mouth daily. 03/27/21  Yes Lance Muss, FNP  ibuprofen (ADVIL) 800 MG tablet Take 1 tablet (800 mg total) by mouth 3 (three) times daily as needed for mild pain or moderate pain. 03/27/21  Yes Lance Muss, FNP  ASPIRIN PO Take 2 tablets by mouth daily.    [provider]  chlorproMAZINE (THORAZINE) 25 MG tablet Take 1 tablet (25 mg total) by mouth  3 (three) times daily. 12/06/18   Elvina Sidle, MD  oseltamivir (TAMIFLU) 75 MG capsule Take 1 capsule (75 mg total) by mouth every 12 (twelve) hours. 12/06/18   Elvina Sidle, MD    Family History Family History  Problem Relation Age of Onset   Hypertension Mother    Hypertension Father     Social History Social History   Tobacco Use   Smoking status: Unknown   Smokeless tobacco: Never  Vaping Use   Vaping Use: Every day  Substance Use Topics   Alcohol use: Yes   Drug use: No     Allergies   Patient has no known allergies.   Review of Systems Review of Systems Per HPI  Physical Exam Triage Vital Signs ED Triage Vitals  Enc Vitals Group     BP 03/27/21 1041 (!) 178/111     Pulse Rate 03/27/21 1041 89     Resp 03/27/21 1045 20     Temp 03/27/21 1041 98.7 F (37.1 C)     Temp Source 03/27/21 1041 Oral     SpO2 03/27/21 1041 97 %     Weight --      Height --      Head Circumference --      Peak Flow --      Pain Score 03/27/21 1041 8  Pain Loc --      Pain Edu? --      Excl. in GC? --    No data found.  Updated Vital Signs BP (!) 178/111 Comment: Provider notified  Pulse 89   Temp 98.7 F (37.1 C) (Oral)   Resp 20   SpO2 97%   Visual Acuity Right Eye Distance:   Left Eye Distance:   Bilateral Distance:    Right Eye Near:   Left Eye Near:    Bilateral Near:     Physical Exam Constitutional:      General: He is not in acute distress.    Appearance: Normal appearance.  HENT:     Head: Normocephalic and atraumatic.  Eyes:     Extraocular Movements: Extraocular movements intact.     Conjunctiva/sclera: Conjunctivae normal.  Cardiovascular:     Rate and Rhythm: Normal rate and regular rhythm.     Pulses: Normal pulses.     Heart sounds: Normal heart sounds.  Pulmonary:     Effort: Pulmonary effort is normal.  Musculoskeletal:     Right knee: Normal.     Left knee: Swelling present. Decreased range of motion. Tenderness present.  Normal pulse.     Comments: Patient is not able to extend left knee completely on exam and has pain with flexion. Pain is generalized surrounding entire knee both posterior and anterior. Swelling is generalized to left knee as well and is circumferential to left knee.   Neurological:     General: No focal deficit present.     Mental Status: He is alert and oriented to person, place, and time. Mental status is at baseline.  Psychiatric:        Mood and Affect: Mood normal.        Behavior: Behavior normal.        Thought Content: Thought content normal.        Judgment: Judgment normal.     UC Treatments / Results  Labs (all labs ordered are listed, but only abnormal results are displayed) Labs Reviewed - No data to display  EKG   Radiology DG Knee Complete 4 Views Left  Result Date: 03/27/2021 CLINICAL DATA:  LEFT knee pain and swelling for 3 days. No known injury. EXAM: LEFT KNEE - COMPLETE 4+ VIEW COMPARISON:  None. FINDINGS: There is no acute fracture or subluxation. Moderate joint effusion is present. No lytic or blastic lesion. There are mild degenerative changes involving the patellofemoral compartment. IMPRESSION: Moderate joint effusion.  Mild degenerative changes. Electronically Signed   By: Norva Pavlov M.D.   On: 03/27/2021 11:18    Procedures Procedures (including critical care time)  Medications Ordered in UC Medications - No data to display  Initial Impression / Assessment and Plan / UC Course  I have reviewed the triage vital signs and the nursing notes.  Pertinent labs & imaging results that were available during my care of the patient were reviewed by me and considered in my medical decision making (see chart for details).  Clinical Course as of 03/27/21 1255  Mon Mar 27, 2021  1103 BP(!): 178/111 [HF]    Clinical Course User Index [HF] Lance Muss, FNP    Left knee effusion present on x-ray. No fracture or bony abnormality present. Will attempt  to reduce pain and inflammation with ibuprofen as needed. Left knee brace provided to patient. Will refer to orthopedist if pain and swelling continues despite current treatment plan. Ice application to left knee 15  minutes at a time 2-3 times daily. RICE.   Patient has significantly elevated BP on exam at 178/111. Hypertension could be related to pain but patient also has had elevated blood pressures on past clinic visits warranting the need for blood pressure treatment. Significant family history of hypertension resulting in stroke per patient. Will treat with low dose amlodipine and have patient follow up with PCP for further management of BP. Patient to check BP at least twice daily and record to prepare for visit to PCP. PCP assistance requested. Side effects of medication discussed with patient. Discussed strict return precautions. Patient verbalized understanding and is agreeable with plan.  Final Clinical Impressions(s) / UC Diagnoses   Final diagnoses:  Effusion of left knee  Acute pain of left knee  Accelerated hypertension     Discharge Instructions      Please apply ice to left knee or 15 minutes at a time 2-3 times daily. May take prescription ibuprofen as needed for knee pain. Do not take any additional ibuprofen or over the counter NSAID's such as Advil or Aleve while on ibuprofen. Wear knee brace daily until pain resolves. Do not sleep in brace. Remove brace if swelling makes brace feel too tight.   Please obtain a home blood pressure cuff and check blood pressure at least twice daily. Record these numbers on provided sheet to take to follow up appointment with primary care physician. Please follow up if top number on blood pressure is still above 140 or below 100. Someone will reach out to you to assist you in finding a primary care physician. It is important that you follow up with a primary care physician as soon as possible for further management of blood pressure.      ED  Prescriptions     Medication Sig Dispense Auth. Provider   amLODipine (NORVASC) 2.5 MG tablet Take 1 tablet (2.5 mg total) by mouth daily. 30 tablet Lance Muss, FNP   ibuprofen (ADVIL) 800 MG tablet Take 1 tablet (800 mg total) by mouth 3 (three) times daily as needed for mild pain or moderate pain. 21 tablet Lance Muss, FNP      PDMP not reviewed this encounter.   Lance Muss, FNP 03/27/21 1155    Lance Muss, FNP 03/27/21 1157    Lance Muss, FNP 03/27/21 1255

## 2021-03-27 NOTE — Discharge Instructions (Addendum)
Please apply ice to left knee or 15 minutes at a time 2-3 times daily. May take prescription ibuprofen as needed for knee pain. Do not take any additional ibuprofen or over the counter NSAID's such as Advil or Aleve while on ibuprofen. Wear knee brace daily until pain resolves. Do not sleep in brace. Remove brace if swelling makes brace feel too tight.   Please obtain a home blood pressure cuff and check blood pressure at least twice daily. Record these numbers on provided sheet to take to follow up appointment with primary care physician. Please follow up if top number on blood pressure is still above 140 or below 100. Someone will reach out to you to assist you in finding a primary care physician. It is important that you follow up with a primary care physician as soon as possible for further management of blood pressure.

## 2021-03-27 NOTE — ED Triage Notes (Signed)
Pt c/o pain in left knee, happened at water park while climbing bars and had dropped down letting go of bars (no fall). Knee swollen with pain to touch, pt able to bend knee. Patient limping on leg. Took tylenol, Advil with no relief.

## 2021-04-03 ENCOUNTER — Encounter: Payer: Self-pay | Admitting: *Deleted

## 2023-11-01 ENCOUNTER — Emergency Department (HOSPITAL_COMMUNITY): Payer: Self-pay

## 2023-11-01 ENCOUNTER — Encounter (HOSPITAL_COMMUNITY): Payer: Self-pay | Admitting: Emergency Medicine

## 2023-11-01 ENCOUNTER — Other Ambulatory Visit: Payer: Self-pay

## 2023-11-01 ENCOUNTER — Emergency Department (HOSPITAL_COMMUNITY)
Admission: EM | Admit: 2023-11-01 | Discharge: 2023-11-01 | Discharge status: Against Medical Advice | Payer: Self-pay | Attending: Emergency Medicine | Admitting: Emergency Medicine

## 2023-11-01 DIAGNOSIS — Z5321 Procedure and treatment not carried out due to patient leaving prior to being seen by health care provider: Secondary | ICD-10-CM | POA: Insufficient documentation

## 2023-11-01 DIAGNOSIS — R42 Dizziness and giddiness: Secondary | ICD-10-CM | POA: Insufficient documentation

## 2023-11-01 LAB — BASIC METABOLIC PANEL
Anion gap: 11 (ref 5–15)
BUN: 21 mg/dL — ABNORMAL HIGH (ref 6–20)
CO2: 24 mmol/L (ref 22–32)
Calcium: 9.4 mg/dL (ref 8.9–10.3)
Chloride: 105 mmol/L (ref 98–111)
Creatinine, Ser: 1.22 mg/dL (ref 0.61–1.24)
GFR, Estimated: >60 mL/min (ref >60)
Glucose, Bld: 93 mg/dL (ref 70–99)
Potassium: 4.3 mmol/L (ref 3.5–5.1)
Sodium: 140 mmol/L (ref 135–145)

## 2023-11-01 LAB — CBG MONITORING, ED: Glucose-Capillary: 93 mg/dL (ref 70–99)

## 2023-11-01 LAB — CBC
HCT: 40.4 % (ref 39.0–52.0)
Hemoglobin: 12.7 g/dL — ABNORMAL LOW (ref 13.0–17.0)
MCH: 29.6 pg (ref 26.0–34.0)
MCHC: 31.4 g/dL (ref 30.0–36.0)
MCV: 94.2 fL (ref 80.0–100.0)
Platelets: 243 10*3/uL (ref 150–400)
RBC: 4.29 MIL/uL (ref 4.22–5.81)
RDW: 12.9 % (ref 11.5–15.5)
WBC: 5.4 10*3/uL (ref 4.0–10.5)
nRBC: 0 % (ref 0.0–0.2)

## 2023-11-01 NOTE — ED Triage Notes (Signed)
Pt to ED via [facility] from work. Pt was walking, felt dizzy, legs were weak and pt leaned against wall. Pt then lowered himself down to ground. EMS reports area being very hot where pt was working.   EMS VS 242/160, 183/128 110bpm Cbg 111 98% RA

## 2023-11-01 NOTE — ED Provider Triage Note (Signed)
Emergency Medicine Provider Triage Evaluation Note  Troy Lowery , a 58 y.o. male  was evaluated in triage.  Pt complains of episode of lightheadedness.  Was working in a warm area then began to feel lightehaded, diaphoretic, and tried to get a sprite but ended up feeling more lightheaded, generally weak and ended up crumpling and catching self as he fell. No focal weakness, numbness, nausea, vomiting, diarrhea, black or bloody stools. BP 240 systolic with EMS.  No chest pain or dyspnea. Has had cough for last 2 weeks.   Fam hx of stroke, htn Past Medical History:  Diagnosis Date   Keloid      Review of Systems  Positive: Lightheadedness Negative: See above  Physical Exam  BP (!) 158/90   Pulse (!) 103   Temp 97.7 F (36.5 C)   Resp 17   Ht 6\' 3"  (1.905 m)   Wt 124.7 kg   SpO2 97%   BMI 34.37 kg/m  Gen:   Awake, no distress   Resp:  Normal effort  MSK:   Moves extremities without difficulty  Other:  Normal neurologic exam  Medical Decision Making  Medically screening exam initiated at 5:10 PM.  Appropriate orders placed.  Gohan Collister Gabrielson was informed that the remainder of the evaluation will be completed by another provider, this initial triage assessment does not replace that evaluation, and the importance of remaining in the ED until their evaluation is complete.  Placed order for XR due to cough.  Additional labs completed.    Alvira Monday, MD 11/01/23 [phone]

## 2023-11-01 NOTE — ED Notes (Signed)
Pt decided to leave 

## 2023-11-01 NOTE — Care Management (Signed)
ED RNCM placed information on AVS to contact [facility] for Emergency Department net follow up.

## 2024-01-07 ENCOUNTER — Encounter (HOSPITAL_COMMUNITY): Payer: Self-pay

## 2024-01-07 ENCOUNTER — Ambulatory Visit (HOSPITAL_COMMUNITY)
Admission: EM | Admit: 2024-01-07 | Discharge: 2024-01-07 | Disposition: A | Discharge status: Home / Self Care | Payer: Self-pay | Attending: Emergency Medicine | Admitting: Emergency Medicine

## 2024-01-07 ENCOUNTER — Ambulatory Visit (INDEPENDENT_AMBULATORY_CARE_PROVIDER_SITE_OTHER): Payer: Self-pay

## 2024-01-07 DIAGNOSIS — M7989 Other specified soft tissue disorders: Secondary | ICD-10-CM

## 2024-01-07 DIAGNOSIS — S6991XA Unspecified injury of right wrist, hand and finger(s), initial encounter: Secondary | ICD-10-CM

## 2024-01-07 DIAGNOSIS — Z203 Contact with and (suspected) exposure to rabies: Secondary | ICD-10-CM

## 2024-01-07 MED ORDER — TETANUS-DIPHTH-ACELL PERTUSSIS 5-2.5-18.5 LF-MCG/0.5 IM SUSY
PREFILLED_SYRINGE | INTRAMUSCULAR | Status: AC
  Filled 2024-01-07: qty 0.5

## 2024-01-07 MED ORDER — ACETAMINOPHEN 325 MG PO TABS
ORAL_TABLET | ORAL | Status: AC
  Filled 2024-01-07: qty 3

## 2024-01-07 MED ORDER — TETANUS-DIPHTH-ACELL PERTUSSIS 5-2.5-18.5 LF-MCG/0.5 IM SUSY
0.5000 mL | PREFILLED_SYRINGE | Freq: Once | INTRAMUSCULAR | Status: AC
  Administered 2024-01-07: 0.5 mL via INTRAMUSCULAR

## 2024-01-07 MED ORDER — IBUPROFEN 800 MG PO TABS: 800.0000 mg | ORAL_TABLET | Freq: Three times a day (TID) | ORAL | 0 refills | Status: AC

## 2024-01-07 MED ORDER — ACETAMINOPHEN 325 MG PO TABS
975.0000 mg | ORAL_TABLET | Freq: Once | ORAL | Status: AC
  Administered 2024-01-07: 975 mg via ORAL

## 2024-01-07 NOTE — Discharge Instructions (Addendum)
 Staff will call you tomorrow with the results of your xray.  Ice - apply for 20 minutes a few times daily (not directly on the skin) Elevation - prop up on a pillow to reduce swelling Ibuprofen - 1 tablet every 6 hours for pain and swelling  We have updated your tetanus vaccine today just in case  Please call the hand specialist to make an appointment for follow up.  Employee Health & Wellness information is below. Please follow up with them for any work restrictions or further workers compensation needs.

## 2024-01-07 NOTE — ED Provider Notes (Signed)
 [facility]    CSN: [id number] Arrival date & time: 01/07/24  1948     History   Chief Complaint Chief Complaint  Patient presents with   Hand Injury    HPI Troy Lowery is a 58 y.o. male.  2 days ago at work reports the dishwasher door closed on his right hand.  Had some pain at first but swelling didn't start until today, and pain worsened.  Currently rating his pain 10/10. No numbness or tingling.  Has not attempted intervention yet Denies prior injury to this hand.    He does not think there were any cuts or open skin Last tetanus > 10 years ago  Past Medical History:  Diagnosis Date   Keloid     There are no active problems to display for this patient.   History reviewed. No pertinent surgical history.    Home Medications    Prior to Admission medications   Medication Sig Start Date End Date Taking? Authorizing Provider  ibuprofen (ADVIL) 800 MG tablet Take 1 tablet (800 mg total) by mouth 3 (three) times daily. 01/07/24  Yes Neil Brickell, Lurena Joiner, PA-C  amLODipine (NORVASC) 2.5 MG tablet Take 1 tablet (2.5 mg total) by mouth daily. 03/27/21   Gustavus Bryant, FNP  ASPIRIN PO Take 2 tablets by mouth daily.    [provider]  chlorproMAZINE (THORAZINE) 25 MG tablet Take 1 tablet (25 mg total) by mouth 3 (three) times daily. 12/06/18   Elvina Sidle, MD    Family History Family History  Problem Relation Age of Onset   Hypertension Mother    Hypertension Father     Social History Social History   Tobacco Use   Smoking status: Unknown   Smokeless tobacco: Never  Vaping Use   Vaping status: Every Day  Substance Use Topics   Alcohol use: Yes   Drug use: No     Allergies   Patient has no known allergies.   Review of Systems Review of Systems Per HPI  Physical Exam Triage Vital Signs ED Triage Vitals  Encounter Vitals Group     BP 01/07/24 2017 (!) 178/99     Systolic BP Percentile --      Diastolic BP Percentile --       Pulse Rate 01/07/24 2017 90     Resp 01/07/24 2017 18     Temp 01/07/24 2017 98.9 F (37.2 C)     Temp Source 01/07/24 2017 Oral     SpO2 01/07/24 2017 94 %     Weight --      Height --      Head Circumference --      Peak Flow --      Pain Score 01/07/24 2018 10     Pain Loc --      Pain Education --      Exclude from Growth Chart --    No data found.  Updated Vital Signs BP (!) 178/99 (BP Location: Left Arm)   Pulse 90   Temp 98.9 F (37.2 C) (Oral)   Resp 18   SpO2 94%   Physical Exam Vitals and nursing note reviewed.  Constitutional:      General: He is not in acute distress. HENT:     Mouth/Throat:     Pharynx: Oropharynx is clear.  Cardiovascular:     Rate and Rhythm: Normal rate and regular rhythm.     Pulses: Normal pulses.  Pulmonary:     Effort:  Pulmonary effort is normal.  Musculoskeletal:     Right hand: Swelling and tenderness present. Normal sensation. Normal capillary refill. Normal pulse.     Comments: Swelling of right dorsal hand and index finger. Full ROM of thumb, middle, ring, and pinky fingers. Can make a fist with index finger extended. No ROM at index finger DIP or PIP. Cap refill < 2 seconds each finger. Sensation intact throughout. Strong radial pulse. See photos  Skin:    General: Skin is warm and dry.     Capillary Refill: Capillary refill takes less than 2 seconds.     Comments: The skin over the swollen hand is slightly erythematous. There is no laceration, abrasion, wound, or bruising noted at this time. No damage to nails.  Neurological:     Mental Status: He is alert and oriented to person, place, and time.         UC Treatments / Results  Labs (all labs ordered are listed, but only abnormal results are displayed) Labs Reviewed - No data to display  EKG   Radiology No results found.  Procedures Procedures (including critical care time)  Medications Ordered in UC Medications  acetaminophen (TYLENOL) tablet 975 mg  (975 mg Oral Given 01/07/24 2032)  Tdap (BOOSTRIX) injection 0.5 mL (0.5 mLs Intramuscular Given 01/07/24 2048)    Initial Impression / Assessment and Plan / UC Course  I have reviewed the triage vital signs and the nursing notes.  Pertinent labs & imaging results that were available during my care of the patient were reviewed by me and considered in my medical decision making (see chart for details).  Tylenol dose given for pain There are no open wounds noted on exam, however with crushing injury, swelling, will go ahead and update tetanus today. Right hand xray *** Ace wrap is applied to hand for swelling and support  Discussed RICE therapy and pain control, close follow with orthopedics. Have also provided with employee health & wellness clinic info for follow up and any work restrictions if needed. Work note for 3 days is provided, discussed urgent care cannot write out for any further than this.  Strict ED precautions for any acute change   Final Clinical Impressions(s) / UC Diagnoses   Final diagnoses:  Injury of right hand, initial encounter  Swelling of right hand     Discharge Instructions      Staff will call you tomorrow with the results of your xray.  Ice - apply for 20 minutes a few times daily (not directly on the skin) Elevation - prop up on a pillow to reduce swelling Ibuprofen - 1 tablet every 6 hours for pain and swelling  We have updated your tetanus vaccine today just in case  Please call the hand specialist to make an appointment for follow up.  Employee Health & Wellness information is below. Please follow up with them for any work restrictions or further workers compensation needs.     ED Prescriptions     Medication Sig Dispense Auth. Provider   ibuprofen (ADVIL) 800 MG tablet Take 1 tablet (800 mg total) by mouth 3 (three) times daily. 21 tablet Kristalynn Coddington, Lurena Joiner, PA-C      PDMP not reviewed this encounter.

## 2024-01-07 NOTE — ED Triage Notes (Signed)
 Pt states the dishwasher door at work came down on rt hand 2 days ago and now swollen.
